# Patient Record
Sex: Male | Born: 1975
Health system: Southern US, Community
[De-identification: ages and names within clinical notes are randomized; demographics above are authoritative.]

## PROBLEM LIST (undated history)

## (undated) DIAGNOSIS — I10 Essential (primary) hypertension: Secondary | ICD-10-CM

## (undated) DIAGNOSIS — G4730 Sleep apnea, unspecified: Secondary | ICD-10-CM

## (undated) DIAGNOSIS — E119 Type 2 diabetes mellitus without complications: Secondary | ICD-10-CM

## (undated) DIAGNOSIS — E559 Vitamin D deficiency, unspecified: Secondary | ICD-10-CM

## (undated) DIAGNOSIS — E785 Hyperlipidemia, unspecified: Secondary | ICD-10-CM

## (undated) HISTORY — DX: Hyperlipidemia, unspecified: E78.5

## (undated) HISTORY — DX: Vitamin D deficiency, unspecified: E55.9

## (undated) HISTORY — DX: Essential (primary) hypertension: I10

## (undated) HISTORY — DX: Sleep apnea, unspecified: G47.30

## (undated) HISTORY — DX: Type 2 diabetes mellitus without complications: E11.9

---

## 2008-02-26 ENCOUNTER — Encounter: Admission: RE | Admit: 2008-02-26 | Discharge: 2008-02-26 | Payer: Self-pay | Admitting: Occupational Medicine

## 2015-03-01 ENCOUNTER — Ambulatory Visit: Payer: Self-pay

## 2015-03-01 ENCOUNTER — Other Ambulatory Visit: Payer: Self-pay | Admitting: Occupational Medicine

## 2015-03-01 DIAGNOSIS — M79671 Pain in right foot: Secondary | ICD-10-CM

## 2015-03-08 ENCOUNTER — Ambulatory Visit: Payer: Self-pay

## 2015-03-08 ENCOUNTER — Other Ambulatory Visit: Payer: Self-pay | Admitting: Occupational Medicine

## 2015-03-08 DIAGNOSIS — M79671 Pain in right foot: Secondary | ICD-10-CM

## 2015-07-03 ENCOUNTER — Encounter (HOSPITAL_BASED_OUTPATIENT_CLINIC_OR_DEPARTMENT_OTHER): Payer: Self-pay | Admitting: *Deleted

## 2015-07-03 ENCOUNTER — Emergency Department (HOSPITAL_BASED_OUTPATIENT_CLINIC_OR_DEPARTMENT_OTHER)
Admission: EM | Admit: 2015-07-03 | Discharge: 2015-07-03 | Disposition: A | Discharge status: Home / Self Care | Payer: BLUE CROSS/BLUE SHIELD | Attending: Emergency Medicine | Admitting: Emergency Medicine

## 2015-07-03 DIAGNOSIS — Z7982 Long term (current) use of aspirin: Secondary | ICD-10-CM | POA: Insufficient documentation

## 2015-07-03 DIAGNOSIS — M5412 Radiculopathy, cervical region: Secondary | ICD-10-CM | POA: Insufficient documentation

## 2015-07-03 DIAGNOSIS — M542 Cervicalgia: Secondary | ICD-10-CM | POA: Diagnosis present

## 2015-07-03 MED ORDER — HYDROCODONE-ACETAMINOPHEN 5-325 MG PO TABS: 1.0000 | ORAL_TABLET | Freq: Four times a day (QID) | ORAL | Status: DC | PRN

## 2015-07-03 NOTE — ED Provider Notes (Signed)
CSN: [id number]     Arrival date & time 07/03/15  1830 History  By signing my name below, I, Alan Wood, attest that this documentation has been prepared under the direction and in the presence of Alan LibraJohn Kamerin Grumbine, MD.   Electronically Signed: [name], ED Scribe. 07/03/2015. 11:13 PM   Chief Complaint  Patient presents with  . Neck and Shoulder Pain    HPI HPI Comments: Alan Wood is a 40 y.o. male who presents to the Emergency Department complaining of gradual onset, constant, sharp, moderate right posterior shoulder pain originating in his neck, ongoing for one week. There is no associated numbness or weakness. His shoulder pain is worse with rotation of his neck to the left. He also notes mild central neck pain with anterior flexion of his neck. Pt received a steroid injection in both hips last week which relieved pain in his left shoulder but the pain has now worsened on the right. Pt is currently taken tizanidine, tramadol, and nabumetone with minimal relief to symptoms. No other worsening or alleviating factors noted. Patient also had some chest tightness earlier today which has resolved. He attributes this to stress.Alan Wood.   History reviewed. No pertinent past medical history. History reviewed. No pertinent past surgical history. No family history on file. Social History  Substance Use Topics  . Smoking status: Never Smoker   . Smokeless tobacco: None  . Alcohol Use: Yes    Review of Systems A complete 10 system review of systems was obtained and all systems are negative except as noted in the HPI and PMH.   Allergies  Review of patient's allergies indicates no known allergies.  Home Medications   Prior to Admission medications   Medication Sig Start Date End Date Taking? Authorizing Provider  aspirin 81 MG tablet Take 81 mg by mouth daily.   Yes Historical Provider, MD  HYDROcodone-acetaminophen (NORCO) 5-325 MG tablet Take 1-2 tablets by mouth every 6 (six) hours as  needed (for pain). 07/03/15   Ireland Chagnon, MD   BP 130/93 mmHg  Pulse 63  Temp(Src) 98.9 F (37.2 C) (Oral)  Resp 18  Ht 6\' 1"  (1.854 m)  Wt 285 lb (129.275 kg)  BMI 37.61 kg/m2  SpO2 97%  Physical Exam General: Well-developed, well-nourished male in no acute distress; appearance consistent with age of record HENT: normocephalic; atraumatic Eyes: pupils equal, round and reactive to light; extraocular muscles intact Neck: supple; rotation of neck to the left reproduces pain in shoulder; central neck pain with anterior flexion of neck Heart: regular rate and rhythm Lungs: clear to auscultation bilaterally Abdomen: soft; nondistended; nontender; no masses or hepatosplenomegaly; bowel sounds present Extremities: No deformity; full range of motion; pulses normal; right posterior shoulder tenderness;  Neurologic: Awake, alert and oriented; motor function intact in all extremities and symmetric; no facial droop Skin: Warm and dry Psychiatric: Normal mood and affect  ED Course  Procedures (including critical care time) DIAGNOSTIC STUDIES: Oxygen Saturation is 97% on RA, normal by my interpretation.     EKG Interpretation   Date/Time:  Monday Jul 03 2015 20:28:38 EDT Ventricular Rate:  83 PR Interval:  164 QRS Duration: 104 QT Interval:  382 QTC Calculation: 448 R Axis:   22 Text Interpretation:  Normal sinus rhythm Possible Left atrial enlargement  Borderline ECG No previous ECGs available Confirmed by Read DriversMOLPUS  MD, Jonny RuizJOHN  [phone] on 07/03/2015 10:39:05 PM      MDM   Final diagnoses:  Cervical radiculopathy   I personally  performed the services described in this documentation, which was scribed in my presence. The recorded information has been reviewed and is accurate.      Alan Libra, MD 07/03/15 [phone]

## 2015-07-03 NOTE — ED Notes (Signed)
MD at bedside. (Molpus) 

## 2015-07-03 NOTE — ED Notes (Signed)
Neck and right shoulder pain. He got an injection in his left last week and that pain went away after he had steroid injections in both his hips.

## 2015-07-03 NOTE — Discharge Instructions (Signed)

## 2015-07-03 NOTE — ED Notes (Signed)
Pt states he is having tightness in the center of his chest. His right shoulder and neck continue to hurt also. EKG requested.

## 2017-11-17 ENCOUNTER — Encounter (HOSPITAL_BASED_OUTPATIENT_CLINIC_OR_DEPARTMENT_OTHER): Payer: Self-pay | Admitting: Emergency Medicine

## 2017-11-17 ENCOUNTER — Other Ambulatory Visit: Payer: Self-pay

## 2017-11-17 ENCOUNTER — Emergency Department (HOSPITAL_BASED_OUTPATIENT_CLINIC_OR_DEPARTMENT_OTHER)
Admission: EM | Admit: 2017-11-17 | Discharge: 2017-11-17 | Disposition: A | Discharge status: Home / Self Care | Payer: Self-pay | Attending: Emergency Medicine | Admitting: Emergency Medicine

## 2017-11-17 ENCOUNTER — Emergency Department (HOSPITAL_BASED_OUTPATIENT_CLINIC_OR_DEPARTMENT_OTHER): Payer: Self-pay

## 2017-11-17 DIAGNOSIS — M5412 Radiculopathy, cervical region: Secondary | ICD-10-CM | POA: Insufficient documentation

## 2017-11-17 DIAGNOSIS — R531 Weakness: Secondary | ICD-10-CM | POA: Insufficient documentation

## 2017-11-17 DIAGNOSIS — Z7982 Long term (current) use of aspirin: Secondary | ICD-10-CM | POA: Insufficient documentation

## 2017-11-17 MED ORDER — CYCLOBENZAPRINE HCL 5 MG PO TABS: 5.0000 mg | ORAL_TABLET | Freq: Three times a day (TID) | ORAL | 0 refills | Status: DC | PRN

## 2017-11-17 MED ORDER — IBUPROFEN 800 MG PO TABS: 800.0000 mg | ORAL_TABLET | Freq: Four times a day (QID) | ORAL | 0 refills | Status: DC | PRN

## 2017-11-17 MED ORDER — IBUPROFEN 800 MG PO TABS
800.0000 mg | ORAL_TABLET | Freq: Once | ORAL | Status: AC
  Administered 2017-11-17: 800 mg via ORAL
  Filled 2017-11-17: qty 1

## 2017-11-17 MED FILL — CYCLOBENZAPRINE 5 MG TABLET: 5 | 3 days supply | Qty: 10 | Fill #0

## 2017-11-17 MED FILL — IBUPROFEN 800 MG TAB: 800 | 5 days supply | Qty: 21 | Fill #0

## 2017-11-17 NOTE — Discharge Instructions (Signed)
Take motrin for pain.   Take flexeril for muscle spasms   You likely have a pinched nerve causing your symptoms.   Follow up with Dr. Pearletha Forge in a week if you still have numbness and weakness  Rest today   Return to ER if you have worse numbness, weakness, trouble speaking, chest pain

## 2017-11-17 NOTE — ED Triage Notes (Signed)
Pt states that Saturday his left hand started hurting. It has gotten progressively worse and now it has the discomfort and numbness/tingling. Pt states it feels like his left arm is weaker than normal. Denies any injury to limb or neck. Denies N/V, dizziness, Lightheadedness, CP, SHOB

## 2017-11-17 NOTE — ED Provider Notes (Signed)
[facility] EMERGENCY DEPARTMENT Provider Note   CSN: [id number] Arrival date & time: 11/17/17  4098     History   Chief Complaint Chief Complaint  Patient presents with  . Hand Pain  . Numbness    HPI Alan Wood is a 42 y.o. male otherwise healthy here with L arm numbness and subjective weakness.  Patient states that for the last 2 to 3 days, he feels some numbness in the left fourth and fifth fingers.  States that he has some subjective weakness but is not dropping anything.  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  Denies any trouble speaking or trouble walking.  States that he has a strong family history of coronary artery disease but denies any chest pain or shortness of breath.  Denies any recent travel history of blood clots.  Patient is otherwise healthy and no meds prior to arrival.  The history is provided by the patient.    History reviewed. No pertinent past medical history.  There are no active problems to display for this patient.   No past surgical history on file.      Home Medications    Prior to Admission medications   Medication Sig Start Date End Date Taking? Authorizing Provider  aspirin 81 MG tablet Take 81 mg by mouth daily.   Yes [provider]  HYDROcodone-acetaminophen (NORCO) 5-325 MG tablet Take 1-2 tablets by mouth every 6 (six) hours as needed (for pain). 07/03/15   Molpus, John, MD    Family History No family history on file.  Social History Social History   Tobacco Use  . Smoking status: Never Smoker  Substance Use Topics  . Alcohol use: Yes  . Drug use: No     Allergies   Patient has no known allergies.   Review of Systems Review of Systems  Neurological: Positive for numbness.  All other systems reviewed and are negative.    Physical Exam Updated Vital Signs BP (!) 131/96   Pulse 77   Temp 98.2 F (36.8 C) (Oral)   Ht 6\' 1"  (1.854 m)   Wt 127 kg   SpO2  95%   BMI 36.94 kg/m   Physical Exam  Constitutional: He is oriented to person, place, and time. He appears well-developed and well-nourished.  HENT:  Head: Normocephalic and atraumatic.  Mouth/Throat: Oropharynx is clear and moist.  Eyes: Pupils are equal, round, and reactive to light. Conjunctivae and EOM are normal.  Neck: Normal range of motion. Neck supple.  No obvious midline tenderness. Nl ROM   Cardiovascular: Normal rate, regular rhythm and normal heart sounds.  Pulmonary/Chest: Effort normal and breath sounds normal. No stridor. No respiratory distress.  Abdominal: Soft. Bowel sounds are normal. He exhibits no distension. There is no tenderness.  Musculoskeletal: Normal range of motion.  2+ pulses throughout   Neurological: He is alert and oriented to person, place, and time.  CN 2- 12 intact, no facial droop. Slightly dec sensation L 4th and 5th fingers but nl hand grasp and finger apposition and adduction. Nl strength bilateral upper and lower extremities. Nl finger to nose bilaterally.   Skin: Skin is warm. Capillary refill takes less than 2 seconds.  Psychiatric: He has a normal mood and affect.  Nursing note and vitals reviewed.    ED Treatments / Results  Labs (all labs ordered are listed, but only abnormal results are displayed) Labs Reviewed - No data to display  EKG EKG Interpretation  Date/Time:  Monday November 17 2017 07:33:05 EDT Ventricular Rate:  76 PR Interval:    QRS Duration: 110 QT Interval:  389 QTC Calculation: 438 R Axis:   28 Text Interpretation:  Sinus rhythm Consider left atrial enlargement No significant change since last tracing Confirmed by Richardean Canal [phone]) on 11/17/2017 7:41:06 AM   Radiology Dg Cervical Spine Complete  Result Date: 11/17/2017 CLINICAL DATA:  Left neck pain and left arm numbness beginning 2 days ago. EXAM: CERVICAL SPINE - COMPLETE 4+ VIEW COMPARISON:  06/25/2015 FINDINGS: There is no evidence of cervical spine  fracture or prevertebral soft tissue swelling. Alignment is normal. No other significant bone abnormalities are identified. IMPRESSION: Negative cervical spine radiographs. Electronically Signed   By: Myles Rosenthal M.D.   On: 11/17/2017 07:17    Procedures Procedures (including critical care time)  Medications Ordered in ED Medications  ibuprofen (ADVIL,MOTRIN) tablet 800 mg (800 mg Oral Given 11/17/17 0715)     Initial Impression / Assessment and Plan / ED Course  I have reviewed the triage vital signs and the nursing notes.  Pertinent labs & imaging results that were available during my care of the patient were reviewed by me and considered in my medical decision making (see chart for details).     Alan Wood is a 42 y.o. male here with L arm numbness. Likely cervical radiculopathy. No chest pain or SOB and no weakness on exam. No neck injury. Will get neck xray. Will give motrin.   7:43 AM EKG and xray unremarkable. Likely cervical radiculopathy. Will dc home with NSAIDs, muscle relaxants, ortho follow up.    Final Clinical Impressions(s) / ED Diagnoses   Final diagnoses:  None    ED Discharge Orders    None       Charlynne Pander, MD 11/17/17 [phone]

## 2017-11-17 NOTE — ED Notes (Signed)
ED Provider at bedside. 

## 2018-12-25 ENCOUNTER — Encounter (HOSPITAL_BASED_OUTPATIENT_CLINIC_OR_DEPARTMENT_OTHER): Payer: Self-pay | Admitting: *Deleted

## 2018-12-25 ENCOUNTER — Other Ambulatory Visit: Payer: Self-pay

## 2018-12-25 ENCOUNTER — Emergency Department (HOSPITAL_BASED_OUTPATIENT_CLINIC_OR_DEPARTMENT_OTHER): Payer: BC Managed Care – PPO

## 2018-12-25 ENCOUNTER — Emergency Department (HOSPITAL_BASED_OUTPATIENT_CLINIC_OR_DEPARTMENT_OTHER)
Admission: EM | Admit: 2018-12-25 | Discharge: 2018-12-26 | Disposition: A | Discharge status: Home / Self Care | Payer: BC Managed Care – PPO | Attending: Emergency Medicine | Admitting: Emergency Medicine

## 2018-12-25 DIAGNOSIS — R072 Precordial pain: Secondary | ICD-10-CM | POA: Insufficient documentation

## 2018-12-25 DIAGNOSIS — Z7982 Long term (current) use of aspirin: Secondary | ICD-10-CM | POA: Insufficient documentation

## 2018-12-25 DIAGNOSIS — R079 Chest pain, unspecified: Secondary | ICD-10-CM | POA: Diagnosis present

## 2018-12-25 LAB — CBC
HCT: 51.4 % (ref 39.0–52.0)
Hemoglobin: 16.7 g/dL (ref 13.0–17.0)
MCH: 28.9 pg (ref 26.0–34.0)
MCHC: 32.5 g/dL (ref 30.0–36.0)
MCV: 88.9 fL (ref 80.0–100.0)
Platelets: 228 10*3/uL (ref 150–400)
RBC: 5.78 MIL/uL (ref 4.22–5.81)
RDW: 12.9 % (ref 11.5–15.5)
WBC: 9.7 10*3/uL (ref 4.0–10.5)
nRBC: 0 % (ref 0.0–0.2)

## 2018-12-25 NOTE — ED Triage Notes (Signed)
Chest pain dull in nature since this am.

## 2018-12-25 NOTE — ED Provider Notes (Signed)
[facility] EMERGENCY DEPARTMENT Provider Note   CSN: [id number] Arrival date & time: 12/25/18  1910     History   Chief Complaint Chief Complaint  Patient presents with  . Chest Pain    HPI Alan Wood is a 43 y.o. male.     Patient with no significant past medical history presents to the emergency department today with chest pain.  Patient describes a dull ache in the left side of his chest starting early this morning.  Symptoms started about 2 AM while he was watching TV.  No exertion.  No preceding symptoms.  He states that he had a negative stress test several years ago.  No associated radiation of pain or diaphoresis.  No vomiting.  Symptoms have resolved and are intermittent at the current time.  States that he laughed earlier and could feel some pain but at time of exam he had no tightness or pain.  Patient denies any recent cough or fever.  He denies any sick contacts including those with coronavirus.  Patient is not a smoker.  No hypertension, hyperlipidemia, diabetes.  Family history significant for several second-degree relatives with heart disease in their 7450s.  Patient denies risk factors for pulmonary embolism including: unilateral leg swelling, history of DVT/PE/other blood clots, use of exogenous hormones, recent immobilizations, recent surgery, recent travel (>4hr segment), malignancy, hemoptysis.       History reviewed. No pertinent past medical history.  There are no active problems to display for this patient.   History reviewed. No pertinent surgical history.      Home Medications    Prior to Admission medications   Medication Sig Start Date End Date Taking? Authorizing Provider  aspirin 81 MG tablet Take 81 mg by mouth daily.   Yes [provider]  cyclobenzaprine (FLEXERIL) 5 MG tablet Take 1 tablet (5 mg total) by mouth 3 (three) times daily as needed for muscle spasms. 11/17/17   Charlynne PanderYao, David Hsienta, MD  HYDROcodone-acetaminophen  (NORCO) 5-325 MG tablet Take 1-2 tablets by mouth every 6 (six) hours as needed (for pain). 07/03/15   Molpus, John, MD  ibuprofen (ADVIL,MOTRIN) 800 MG tablet Take 1 tablet (800 mg total) by mouth every 6 (six) hours as needed. 11/17/17   Charlynne PanderYao, David Hsienta, MD    Family History No family history on file.  Social History Social History   Tobacco Use  . Smoking status: Never Smoker  . Smokeless tobacco: Never Used  Substance Use Topics  . Alcohol use: Yes  . Drug use: No     Allergies   Patient has no known allergies.   Review of Systems Review of Systems  Constitutional: Negative for diaphoresis and fever.  Eyes: Negative for redness.  Respiratory: Negative for cough and shortness of breath.   Cardiovascular: Positive for chest pain. Negative for palpitations and leg swelling.  Gastrointestinal: Negative for abdominal pain, nausea and vomiting.  Genitourinary: Negative for dysuria.  Musculoskeletal: Negative for back pain and neck pain.  Skin: Negative for rash.  Neurological: Negative for syncope and light-headedness.  Psychiatric/Behavioral: The patient is not nervous/anxious.      Physical Exam Updated Vital Signs BP (!) 135/101 (BP Location: Right Arm)   Pulse 70   Temp 99.1 F (37.3 C) (Oral)   Resp (!) 22   Ht 6\' 1"  (1.854 m)   Wt 129.3 kg   SpO2 96%   BMI 37.60 kg/m   Physical Exam Vitals signs and nursing note reviewed.  Constitutional:  Appearance: He is well-developed. He is not diaphoretic.  HENT:     Head: Normocephalic and atraumatic.     Mouth/Throat:     Mouth: Mucous membranes are not dry.  Eyes:     Conjunctiva/sclera: Conjunctivae normal.  Neck:     Musculoskeletal: Normal range of motion and neck supple. No muscular tenderness.     Vascular: Normal carotid pulses. No carotid bruit or JVD.     Trachea: Trachea normal. No tracheal deviation.  Cardiovascular:     Rate and Rhythm: Normal rate and regular rhythm.     Pulses: No  decreased pulses.     Heart sounds: Normal heart sounds, S1 normal and S2 normal. Heart sounds not distant. No murmur.  Pulmonary:     Effort: Pulmonary effort is normal. No respiratory distress.     Breath sounds: Normal breath sounds. No wheezing.  Chest:     Chest wall: No tenderness.  Abdominal:     General: Bowel sounds are normal.     Palpations: Abdomen is soft.     Tenderness: There is no abdominal tenderness. There is no guarding or rebound.  Skin:    General: Skin is warm and dry.     Coloration: Skin is not pale.  Neurological:     Mental Status: He is alert.      ED Treatments / Results  Labs (all labs ordered are listed, but only abnormal results are displayed) Labs Reviewed  CBC  BASIC METABOLIC PANEL  TROPONIN I (HIGH SENSITIVITY)  TROPONIN I (HIGH SENSITIVITY)    ED ECG REPORT   Date: 12/25/2018  Rate: 76  Rhythm: normal sinus rhythm  QRS Axis: normal  Intervals: normal  ST/T Wave abnormalities: normal  Conduction Disutrbances:none  Narrative Interpretation:   Old EKG Reviewed: unchanged  I have personally reviewed the EKG tracing and agree with the computerized printout as noted.  Radiology Dg Chest 2 View  Result Date: 12/25/2018 CLINICAL DATA:  Chest pain and shortness of breath. EXAM: CHEST - 2 VIEW COMPARISON:  02/26/2008 FINDINGS: The cardiomediastinal contours are normal. Mild streaky bibasilar opacities, left greater than right. Pulmonary vasculature is normal. No consolidation, pleural effusion, or pneumothorax. No acute osseous abnormalities are seen. IMPRESSION: Streaky bibasilar opacities, left greater than right. Findings favor atelectasis, however atypical viral pneumonia could have a similar appearance. Electronically Signed   By: Keith Rake M.D.   On: 12/25/2018 20:34    Procedures Procedures (including critical care time)  Medications Ordered in ED Medications - No data to display   Initial Impression / Assessment and  Plan / ED Course  I have reviewed the triage vital signs and the nursing notes.  Pertinent labs & imaging results that were available during my care of the patient were reviewed by me and considered in my medical decision making (see chart for details).        Patient seen and examined.  EKG and chest x-ray reviewed.  I doubt ACS or PE given atypical features.  Doubt pneumonia despite streaky opacities noted on the patient's x-ray.  He does not have any shortness of breath or other symptoms which would be concerning for coronavirus.  Vital signs reviewed and are as follows: BP (!) 135/101 (BP Location: Right Arm)   Pulse 70   Temp 99.1 F (37.3 C) (Oral)   Resp (!) 22   Ht 6\' 1"  (1.854 m)   Wt 129.3 kg   SpO2 96%   BMI 37.60 kg/m   12:24  AM troponin, CBC, BMP all normal.  Patient remains comfortable, no recurrent pain.  We will discharge home at this time with return instructions as above.  Encouraged PCP follow-up.  Patient was counseled to return with severe chest pain, especially if the pain is crushing or pressure-like and spreads to the arms, back, neck, or jaw, or if they have sweating, nausea, or shortness of breath with the pain. They were encouraged to call 911 with these symptoms.   They were also told to return if their chest pain gets worse and does not go away with rest, they have an attack of chest pain lasting longer than usual despite rest and treatment with the medications their caregiver has prescribed, if they wake from sleep with chest pain or shortness of breath, if they feel dizzy or faint, if they have chest pain not typical of their usual pain, or if they have any other emergent concerns regarding their health.  The patient verbalized understanding and agreed.     Final Clinical Impressions(s) / ED Diagnoses   Final diagnoses:  Precordial pain   Patient with atypical chest pain with normal troponin, nonischemic EKG.  No clinical features consistent with  DVT/PE.  Unclear etiology but doubt ACS, PE, dissection.  Patient is pain-free at time of discharge and appears comfortable.  Low risk heart score.  Encourage PCP follow-up, strict return as above.  ED Discharge Orders    None       Hassel, Uphoff, PA-C 12/26/18 Vladimir Crofts, MD 01/03/19 [phone]

## 2018-12-26 LAB — BASIC METABOLIC PANEL
Anion gap: 11 (ref 5–15)
BUN: 12 mg/dL (ref 6–20)
CO2: 25 mmol/L (ref 22–32)
Calcium: 9.1 mg/dL (ref 8.9–10.3)
Chloride: 100 mmol/L (ref 98–111)
Creatinine, Ser: 0.89 mg/dL (ref 0.61–1.24)
GFR calc Af Amer: >60 mL/min (ref >60)
GFR calc non Af Amer: >60 mL/min (ref >60)
Glucose, Bld: 94 mg/dL (ref 70–99)
Potassium: 3.6 mmol/L (ref 3.5–5.1)
Sodium: 136 mmol/L (ref 135–145)

## 2018-12-26 LAB — TROPONIN I (HIGH SENSITIVITY): Troponin I (High Sensitivity): <2 ng/L (ref <18)

## 2018-12-26 NOTE — Discharge Instructions (Signed)
Please read and follow all provided instructions.  Your diagnoses today include:  1. Precordial pain     Tests performed today include:  An EKG of your heart  A chest x-ray  Cardiac enzymes - a blood test for heart muscle damage  Blood counts and electrolytes  Vital signs. See below for your results today.   Medications prescribed:   None  Take any prescribed medications only as directed.  Follow-up instructions: Please follow-up with your primary care provider as soon as you can for further evaluation of your symptoms.   Return instructions:  SEEK IMMEDIATE MEDICAL ATTENTION IF:  You have severe chest pain, especially if the pain is crushing or pressure-like and spreads to the arms, back, neck, or jaw, or if you have sweating, nausea (feeling sick to your stomach), or shortness of breath. THIS IS AN EMERGENCY. Don't wait to see if the pain will go away. Get medical help at once. Call 911 or 0 (operator). DO NOT drive yourself to the hospital.   Your chest pain gets worse and does not go away with rest.   You have an attack of chest pain lasting longer than usual, despite rest and treatment with the medications your caregiver has prescribed.   You wake from sleep with chest pain or shortness of breath.  You feel dizzy or faint.  You have chest pain not typical of your usual pain for which you originally saw your caregiver.   You have any other emergent concerns regarding your health.  Additional Information: Chest pain comes from many different causes. Your caregiver has diagnosed you as having chest pain that is not specific for one problem, but does not require admission.  You are at low risk for an acute heart condition or other serious illness.   Your vital signs today were: BP (!) 135/101 (BP Location: Right Arm)    Pulse 70    Temp 99.1 F (37.3 C) (Oral)    Resp (!) 22    Ht 6\' 1"  (1.854 m)    Wt 129.3 kg    SpO2 96%    BMI 37.60 kg/m  If your blood pressure  (BP) was elevated above 135/85 this visit, please have this repeated by your doctor within one month. --------------

## 2019-12-22 ENCOUNTER — Other Ambulatory Visit: Payer: Self-pay

## 2019-12-22 ENCOUNTER — Encounter (HOSPITAL_BASED_OUTPATIENT_CLINIC_OR_DEPARTMENT_OTHER): Payer: Self-pay | Admitting: *Deleted

## 2019-12-22 ENCOUNTER — Emergency Department (HOSPITAL_BASED_OUTPATIENT_CLINIC_OR_DEPARTMENT_OTHER)
Admission: EM | Admit: 2019-12-22 | Discharge: 2019-12-22 | Disposition: A | Discharge status: Home / Self Care | Payer: BC Managed Care – PPO | Attending: Emergency Medicine | Admitting: Emergency Medicine

## 2019-12-22 DIAGNOSIS — S39012A Strain of muscle, fascia and tendon of lower back, initial encounter: Secondary | ICD-10-CM | POA: Insufficient documentation

## 2019-12-22 DIAGNOSIS — M25512 Pain in left shoulder: Secondary | ICD-10-CM | POA: Insufficient documentation

## 2019-12-22 DIAGNOSIS — Z7982 Long term (current) use of aspirin: Secondary | ICD-10-CM | POA: Diagnosis not present

## 2019-12-22 DIAGNOSIS — Y33XXXA Other specified events, undetermined intent, initial encounter: Secondary | ICD-10-CM | POA: Diagnosis not present

## 2019-12-22 DIAGNOSIS — M542 Cervicalgia: Secondary | ICD-10-CM | POA: Diagnosis not present

## 2019-12-22 DIAGNOSIS — T148XXA Other injury of unspecified body region, initial encounter: Secondary | ICD-10-CM

## 2019-12-22 MED ORDER — PREDNISONE 10 MG (21) PO TBPK: ORAL_TABLET | Freq: Every day | ORAL | 0 refills | Status: DC

## 2019-12-22 MED ORDER — METHOCARBAMOL 500 MG PO TABS: 500.0000 mg | ORAL_TABLET | Freq: Two times a day (BID) | ORAL | 0 refills | Status: DC

## 2019-12-22 NOTE — ED Triage Notes (Signed)
Back pain started 10/31.  Went to UC on 11/3.  Prescription for cyclobenzaprine & Ibuprofen with no relief. Left side of neck, shoulder and left arm pain started Monday.  Denies injury.

## 2019-12-22 NOTE — ED Provider Notes (Signed)
[facility] EMERGENCY DEPARTMENT Provider Note   CSN: [id number] Arrival date & time: 12/22/19  1057     History Chief Complaint  Patient presents with  . Neck, shoulder, back pain    Alan Wood is a 44 y.o. male.  HPI Pt is a 44 year old male who presents to the ED due to left sided neck pain. He states that his pain initially started in the left thoracic region and spread upwards. He was seen 1 week ago at [facility] and given Flexeril, APAP and ibuprofen. He has been taking these with little relief. He now notes his pain is along the left neck. Worsens with head movement. Radiates to left shoulder. No numbness, tingling, weakness, CP, SOB, difficulty swallowing.     History reviewed. No pertinent past medical history.  There are no problems to display for this patient.   History reviewed. No pertinent surgical history.     History reviewed. No pertinent family history.  Social History   Tobacco Use  . Smoking status: Never Smoker  . Smokeless tobacco: Never Used  Substance Use Topics  . Alcohol use: Yes    Comment: occasionally  . Drug use: No    Home Medications Prior to Admission medications   Medication Sig Start Date End Date Taking? Authorizing Provider  aspirin 81 MG tablet Take 81 mg by mouth daily.    [provider]  cyclobenzaprine (FLEXERIL) 5 MG tablet Take 1 tablet (5 mg total) by mouth 3 (three) times daily as needed for muscle spasms. 11/17/17   Charlynne Pander, MD  HYDROcodone-acetaminophen (NORCO) 5-325 MG tablet Take 1-2 tablets by mouth every 6 (six) hours as needed (for pain). 07/03/15   Molpus, John, MD  ibuprofen (ADVIL,MOTRIN) 800 MG tablet Take 1 tablet (800 mg total) by mouth every 6 (six) hours as needed. 11/17/17   Charlynne Pander, MD  meloxicam (MOBIC) 15 MG tablet Take 15 mg by mouth daily. 12/15/19   [provider]  methocarbamol (ROBAXIN) 500 MG tablet Take 1 tablet (500 mg total) by mouth 2 (two) times  daily. 12/22/19   Placido Sou, PA-C  predniSONE (STERAPRED UNI-PAK 21 TAB) 10 MG (21) TBPK tablet Take by mouth daily. Take 6 tabs by mouth daily  for 2 days, then 5 tabs for 2 days, then 4 tabs for 2 days, then 3 tabs for 2 days, 2 tabs for 2 days, then 1 tab by mouth daily for 2 days 12/22/19   Placido Sou, PA-C    Allergies    Patient has no known allergies.  Review of Systems   Review of Systems  Constitutional: Negative for chills and fever.  Respiratory: Negative for shortness of breath.   Cardiovascular: Negative for chest pain.  Musculoskeletal: Positive for myalgias and neck pain. Negative for back pain and neck stiffness.  Neurological: Negative for weakness and numbness.    Physical Exam Updated Vital Signs BP (!) 150/103 (BP Location: Left Arm)   Pulse 90   Temp 98.5 F (36.9 C) (Oral)   Resp 16   Ht 6\' 1"  (1.854 m)   Wt 136.1 kg   SpO2 97%   BMI 39.58 kg/m   Physical Exam Vitals and nursing note reviewed.  Constitutional:      General: He is not in acute distress.    Appearance: He is well-developed.  HENT:     Head: Normocephalic and atraumatic.     Right Ear: External ear normal.     Left  Ear: External ear normal.  Eyes:     General: No scleral icterus.       Right eye: No discharge.        Left eye: No discharge.     Conjunctiva/sclera: Conjunctivae normal.  Neck:     Trachea: No tracheal deviation.     Comments: No midline C, T, or L spine tenderness. Moderate TTP noted to the left neck along the paraspinal musculature. Additional moderate TTP noted to the musculature of the left superior shoulder. No bony TTP. No midline spine TTP. Pain worsens with right lateral rotation of the head. Cardiovascular:     Rate and Rhythm: Normal rate.  Pulmonary:     Effort: Pulmonary effort is normal. No respiratory distress.     Breath sounds: No stridor.  Abdominal:     General: There is no distension.  Musculoskeletal:        General: Tenderness  present. No swelling or deformity.     Cervical back: Normal range of motion and neck supple. Tenderness present.  Skin:    General: Skin is warm and dry.     Findings: No rash.  Neurological:     General: No focal deficit present.     Mental Status: He is alert and oriented to person, place, and time.     Cranial Nerves: Cranial nerve deficit: no gross deficits.     Comments: Strength is 5/5 in all four extremities. Distal sensation intact.    ED Results / Procedures / Treatments   Labs (all labs ordered are listed, but only abnormal results are displayed) Labs Reviewed - No data to display  EKG None  Radiology No results found.  Procedures Procedures (including critical care time)  Medications Ordered in ED Medications - No data to display  ED Course  I have reviewed the triage vital signs and the nursing notes.  Pertinent labs & imaging results that were available during my care of the patient were reviewed by me and considered in my medical decision making (see chart for details).    MDM Rules/Calculators/A&P                         Pt is a 44 y.o. male that presents with a history, physical exam, and ED Clinical Course as noted above.   Pt presents with muscular pain along the left neck and left shoulder. No bony tenderness. Neuro exam is benign.  No fevers or chills.  Did not feel imaging was warranted and patient is in agreement. No midline spine tenderness. 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 I believe he might be experiencing muscle spasms due to a lack of movement from his job which may be worsening due to being in a seated position throughout the day.   Recommended patient discontinue the Flexeril as he is not getting any relief.  We will start him on Robaxin.  We discussed a regimen for Tylenol and ibuprofen.  Multiple topical analgesics.  Patient has no known history of diabetes mellitus.  We will start him on a course of prednisone as  well.  He understands to return to the ED with any new or worsening symptoms.  Recommended that he follow-up with his PCP if his symptoms do not improve.  His questions were answered and he was amicable at the time of discharge.  He was mildly hypertensive but otherwise his vital signs are stable.  No chest pain or shortness of breath.  He is planning on discussing his blood pressure with his PCP at an upcoming appointment he already has scheduled.   An After Visit Summary was printed and given to the patient.  Patient discharged to home/self care.  Condition at discharge: Stable  Note: Portions of this report may have been transcribed using voice recognition software. Every effort was made to ensure accuracy; however, inadvertent computerized transcription errors may be present.   Final Clinical Impression(s) / ED Diagnoses Final diagnoses:  Neck pain  Acute pain of left shoulder  Muscle strain   Rx / DC Orders ED Discharge Orders         Ordered    methocarbamol (ROBAXIN) 500 MG tablet  2 times daily        12/22/19 1158    predniSONE (STERAPRED UNI-PAK 21 TAB) 10 MG (21) TBPK tablet  Daily        12/22/19 1158           Placido Sou, PA-C 12/22/19 1224    Little, Ambrose Finland, MD 12/23/19 [phone]

## 2019-12-22 NOTE — Discharge Instructions (Addendum)
I recommend a combination of tylenol and ibuprofen for management of your pain. You can take a low dose of both at the same time. I recommend 325 mg of Tylenol combined with 400 mg of ibuprofen. This is one regular Tylenol and two regular ibuprofen. You can take these 2-3 times for day for your pain. Please try to take these medications with a small amount of food as well to prevent upsetting your stomach.  Also, please consider topical pain relieving creams such as Voltaran Gel, BioFreeze, or Icy Hot. There is also a pain relieving cream made by [employer]. You should be able to find all of these at your local pharmacy.   I am prescribing you a strong muscle relaxer called robaxin. Do not mix it with alcohol. Do not drive a vehicle after taking it.  Please return to the ER with any new or worsening symptoms.  Please follow-up to primary care provider.  It was a pleasure to meet you.

## 2019-12-22 NOTE — ED Notes (Signed)
Review D/C papers with pt, reviewed RX with pt, pt states understanding, pt denies questions at this time.

## 2020-05-18 ENCOUNTER — Emergency Department (HOSPITAL_BASED_OUTPATIENT_CLINIC_OR_DEPARTMENT_OTHER): Payer: BC Managed Care – PPO

## 2020-05-18 ENCOUNTER — Encounter (HOSPITAL_BASED_OUTPATIENT_CLINIC_OR_DEPARTMENT_OTHER): Payer: Self-pay | Admitting: *Deleted

## 2020-05-18 ENCOUNTER — Other Ambulatory Visit: Payer: Self-pay

## 2020-05-18 ENCOUNTER — Emergency Department (HOSPITAL_BASED_OUTPATIENT_CLINIC_OR_DEPARTMENT_OTHER)
Admission: EM | Admit: 2020-05-18 | Discharge: 2020-05-18 | Disposition: A | Discharge status: Home / Self Care | Payer: BC Managed Care – PPO | Attending: Emergency Medicine | Admitting: Emergency Medicine

## 2020-05-18 DIAGNOSIS — M545 Low back pain, unspecified: Secondary | ICD-10-CM

## 2020-05-18 DIAGNOSIS — Z7982 Long term (current) use of aspirin: Secondary | ICD-10-CM | POA: Insufficient documentation

## 2020-05-18 DIAGNOSIS — M542 Cervicalgia: Secondary | ICD-10-CM | POA: Diagnosis not present

## 2020-05-18 DIAGNOSIS — Y92410 Unspecified street and highway as the place of occurrence of the external cause: Secondary | ICD-10-CM | POA: Insufficient documentation

## 2020-05-18 DIAGNOSIS — M79602 Pain in left arm: Secondary | ICD-10-CM | POA: Insufficient documentation

## 2020-05-18 DIAGNOSIS — M549 Dorsalgia, unspecified: Secondary | ICD-10-CM | POA: Diagnosis present

## 2020-05-18 MED ORDER — METHOCARBAMOL 500 MG PO TABS: 500.0000 mg | ORAL_TABLET | Freq: Two times a day (BID) | ORAL | 0 refills | Status: DC
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
## 2020-05-18 NOTE — Discharge Instructions (Signed)
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
## 2021-03-16 ENCOUNTER — Emergency Department (HOSPITAL_BASED_OUTPATIENT_CLINIC_OR_DEPARTMENT_OTHER)
Admission: EM | Admit: 2021-03-16 | Discharge: 2021-03-16 | Disposition: A | Discharge status: Home / Self Care | Payer: BC Managed Care – PPO | Attending: Emergency Medicine | Admitting: Emergency Medicine

## 2021-03-16 ENCOUNTER — Other Ambulatory Visit: Payer: Self-pay

## 2021-03-16 ENCOUNTER — Encounter (HOSPITAL_BASED_OUTPATIENT_CLINIC_OR_DEPARTMENT_OTHER): Payer: Self-pay | Admitting: Emergency Medicine

## 2021-03-16 ENCOUNTER — Other Ambulatory Visit (HOSPITAL_BASED_OUTPATIENT_CLINIC_OR_DEPARTMENT_OTHER): Payer: Self-pay

## 2021-03-16 DIAGNOSIS — M5442 Lumbago with sciatica, left side: Secondary | ICD-10-CM | POA: Insufficient documentation

## 2021-03-16 DIAGNOSIS — M545 Low back pain, unspecified: Secondary | ICD-10-CM | POA: Diagnosis present

## 2021-03-16 MED ORDER — CYCLOBENZAPRINE HCL 10 MG PO TABS
10.0000 mg | ORAL_TABLET | Freq: Two times a day (BID) | ORAL | 0 refills | Status: AC | PRN
  Filled 2021-03-16: qty 20, 10d supply, fill #0

## 2021-03-16 MED ORDER — IBUPROFEN 800 MG PO TABS
800.0000 mg | ORAL_TABLET | Freq: Three times a day (TID) | ORAL | 0 refills | Status: AC
  Filled 2021-03-16: qty 21, 7d supply, fill #0

## 2021-03-16 MED ORDER — KETOROLAC TROMETHAMINE 60 MG/2ML IM SOLN
60.0000 mg | Freq: Once | INTRAMUSCULAR | Status: DC
  Filled 2021-03-16: qty 2

## 2021-03-16 MED ORDER — METHYLPREDNISOLONE 4 MG PO TBPK
ORAL_TABLET | ORAL | 0 refills | Status: AC
  Filled 2021-03-16: qty 21, 6d supply, fill #0

## 2021-03-16 NOTE — ED Triage Notes (Signed)
Lower back pain x 1 week, started going down left leg 2 days ago.  Progressively worsening.  Pt drove himself here.  No elimination issues.  Some numbness in left leg.

## 2021-03-16 NOTE — ED Provider Notes (Signed)
[facility] EMERGENCY DEPARTMENT Provider Note   CSN: [id number] Arrival date & time: 03/16/21  1007     History  Chief Complaint  Patient presents with   Leg Pain   Back Pain    Alan Wood is a 46 y.o. male.  The history is provided by the patient.  Back Pain Location:  Lumbar spine Quality:  Aching Radiates to: left leg. Pain severity:  Mild Onset quality:  Gradual Duration:  1 week Timing:  Intermittent Progression:  Waxing and waning Context: lifting heavy objects   Worsened by:  Palpation and movement Associated symptoms: leg pain and paresthesias   Associated symptoms: no abdominal pain, no abdominal swelling, no bladder incontinence, no bowel incontinence, no chest pain, no fever, no headaches, no numbness, no pelvic pain, no perianal numbness, no tingling, no weakness and no weight loss       Home Medications Prior to Admission medications   Medication Sig Start Date End Date Taking? Authorizing Provider  cyclobenzaprine (FLEXERIL) 10 MG tablet Take 1 tablet (10 mg total) by mouth 2 (two) times daily as needed for muscle spasms. 03/16/21  Yes Eivan Gallina, DO  ibuprofen (ADVIL) 800 MG tablet Take 1 tablet (800 mg total) by mouth 3 (three) times daily. 03/16/21  Yes Delphia Kaylor, DO  methylPREDNISolone (MEDROL DOSEPAK) 4 MG TBPK tablet Follow package insert 03/16/21  Yes Anup Brigham, DO  aspirin 81 MG tablet Take 81 mg by mouth daily.    [provider]      Allergies    Patient has no known allergies.    Review of Systems   Review of Systems  Constitutional:  Negative for fever and weight loss.  Cardiovascular:  Negative for chest pain.  Gastrointestinal:  Negative for abdominal pain and bowel incontinence.  Genitourinary:  Negative for bladder incontinence and pelvic pain.  Musculoskeletal:  Positive for back pain.  Neurological:  Positive for paresthesias. Negative for tingling, weakness, numbness and headaches.   Physical  Exam Updated Vital Signs BP (!) 157/113 (BP Location: Right Arm)    Pulse 96    Temp 97.8 F (36.6 C) (Oral)    Resp 18    Ht 6\' 1"  (1.854 m)    Wt 131.5 kg    SpO2 99%    BMI 38.26 kg/m  Physical Exam Constitutional:      General: He is not in acute distress.    Appearance: He is not ill-appearing.  Cardiovascular:     Rate and Rhythm: Normal rate.     Pulses: Normal pulses.     Heart sounds: Normal heart sounds.  Musculoskeletal:        General: Tenderness present. No swelling. Normal range of motion.     Cervical back: Normal range of motion.     Comments: Tenderness to palpation to the paraspinal lumbar muscles on the left as well as the left gluteal muscles  Skin:    General: Skin is warm.     Capillary Refill: Capillary refill takes less than 2 seconds.  Neurological:     General: No focal deficit present.     Mental Status: He is alert.     Comments: 5+ out of 5 strength throughout, normal sensation    ED Results / Procedures / Treatments   Labs (all labs ordered are listed, but only abnormal results are displayed) Labs Reviewed - No data to display  EKG None  Radiology No results found.  Procedures Procedures    Medications Ordered  in ED Medications  ketorolac (TORADOL) injection 60 mg (has no administration in time range)    ED Course/ Medical Decision Making/ A&P                           Medical Decision Making Risk Prescription drug management.   [name] is here with low back pain.  Sciatic type pain radiating down the left leg.  No numbness or weakness.  May be some intermittent paresthesias.  No midline spinal tenderness.  No trauma.  Overuse injury likely from heavy lifting at work.  He is tender to the paraspinal muscles on the left as well as left gluteal muscles.  He is neurovascular neuromuscularly intact otherwise.  Overall suspect sciatica versus muscle spasm.  Other diagnosis considered but thought to be less likely a cauda equina,  epidural abscess, fracture.  He has no midline spinal tenderness.  He has no cauda equina symptoms.  No loss of bowel or bladder.  No urinary retention.  Overall we will prescribe Motrin, Medrol Dosepak, Flexeril.  Recommend Tylenol use at home as well.  Given a shot of Toradol in the ED.  Educated about sciatica.  Given information to follow-up with sports medicine.  Discharged in good condition.  Written off work for a few days.  This chart was dictated using voice recognition software.  Despite best efforts to proofread,  errors can occur which can change the documentation meaning.         Final Clinical Impression(s) / ED Diagnoses Final diagnoses:  Acute left-sided low back pain with left-sided sciatica    Rx / DC Orders ED Discharge Orders          Ordered    ibuprofen (ADVIL) 800 MG tablet  3 times daily        03/16/21 0903    cyclobenzaprine (FLEXERIL) 10 MG tablet  2 times daily PRN        03/16/21 0903    methylPREDNISolone (MEDROL DOSEPAK) 4 MG TBPK tablet        03/16/21 0903              Virgina Norfolk, DO 03/16/21 [phone]

## 2021-03-16 NOTE — Discharge Instructions (Addendum)
Recommend 800 mg ibuprofen every 8 hours as needed for pain.  Recommend 1000 mg of Tylenol every 6 hours as needed for pain.  Take Medrol Dosepak as prescribed.  Take Flexeril as prescribed.  This is a muscle relaxant.  Do not mix with alcohol or drugs or dangerous activities including driving.  Follow-up with your primary care doctor or sports medicine.

## 2021-03-19 ENCOUNTER — Other Ambulatory Visit: Payer: Self-pay

## 2021-03-19 ENCOUNTER — Encounter (HOSPITAL_BASED_OUTPATIENT_CLINIC_OR_DEPARTMENT_OTHER): Payer: Self-pay

## 2021-03-19 ENCOUNTER — Emergency Department (HOSPITAL_BASED_OUTPATIENT_CLINIC_OR_DEPARTMENT_OTHER)
Admission: EM | Admit: 2021-03-19 | Discharge: 2021-03-19 | Disposition: A | Discharge status: Home / Self Care | Payer: BC Managed Care – PPO | Attending: Emergency Medicine | Admitting: Emergency Medicine

## 2021-03-19 DIAGNOSIS — M5440 Lumbago with sciatica, unspecified side: Secondary | ICD-10-CM | POA: Diagnosis present

## 2021-03-19 DIAGNOSIS — R209 Unspecified disturbances of skin sensation: Secondary | ICD-10-CM | POA: Insufficient documentation

## 2021-03-19 DIAGNOSIS — M545 Low back pain, unspecified: Secondary | ICD-10-CM

## 2021-03-19 DIAGNOSIS — Z7982 Long term (current) use of aspirin: Secondary | ICD-10-CM | POA: Insufficient documentation

## 2021-03-19 MED ORDER — METHYLPREDNISOLONE SODIUM SUCC 125 MG IJ SOLR
125.0000 mg | Freq: Once | INTRAMUSCULAR | Status: AC
  Administered 2021-03-19: 125 mg via INTRAMUSCULAR
  Filled 2021-03-19: qty 2

## 2021-03-19 MED ORDER — KETOROLAC TROMETHAMINE 60 MG/2ML IM SOLN
60.0000 mg | Freq: Once | INTRAMUSCULAR | Status: AC
  Administered 2021-03-19: 60 mg via INTRAMUSCULAR
  Filled 2021-03-19: qty 2

## 2021-03-19 NOTE — Discharge Instructions (Signed)
Continue curretn medications

## 2021-03-19 NOTE — ED Triage Notes (Signed)
Pt arrives ambulatory to ED with reports of lower back pain X1 week states that he was seen here on Friday for the same was offered an 'injection' but did not take it. Pt reports he has been taking the 2 oral medications he was prescribed but states the pain is not getting better. Now having some numbness in his left toes. States pain is radiating into left leg.

## 2021-03-20 NOTE — ED Provider Notes (Signed)
[facility] EMERGENCY DEPARTMENT Provider Note   CSN: [id number] Arrival date & time: 03/19/21  1049     History  Chief Complaint  Patient presents with   Back Pain    Alan Wood is a 46 y.o. male.  Patient reports he was seen on 2 3 with low back pain he was started on Flexeril ibuprofen and prednisone patient reports he continues to have pain but pain is now going down his leg patient reports he has some numbness in his toes.  Patient reports he is unable to see his primary care doctor as they do not have appointments for several weeks.  The history is provided by the patient. No language interpreter was used.  Back Pain Location:  Lumbar spine Radiates to:  Does not radiate Pain severity:  Moderate Pain is:  Same all the time Progression:  Worsening Chronicity:  New Relieved by:  Nothing Worsened by:  Nothing Associated symptoms: no abdominal pain, no fever and no weakness   Risk factors: no hx of osteoporosis       Home Medications Prior to Admission medications   Medication Sig Start Date End Date Taking? Authorizing Provider  aspirin 81 MG tablet Take 81 mg by mouth daily.    [provider]  cyclobenzaprine (FLEXERIL) 10 MG tablet Take 1 tablet (10 mg total) by mouth 2 (two) times daily as needed for muscle spasms. 03/16/21   Curatolo, Adam, DO  ibuprofen (ADVIL) 800 MG tablet Take 1 tablet (800 mg total) by mouth 3 (three) times daily. 03/16/21   Curatolo, Adam, DO  methylPREDNISolone (MEDROL DOSEPAK) 4 MG TBPK tablet Follow package insert, see back of pack for instructions 03/16/21   Virgina Norfolk, DO      Allergies    Patient has no known allergies.    Review of Systems   Review of Systems  Constitutional:  Negative for fever.  Gastrointestinal:  Negative for abdominal pain.  Musculoskeletal:  Positive for back pain.  Neurological:  Negative for weakness.  All other systems reviewed and are negative.  Physical Exam Updated Vital  Signs BP (!) 143/109 (BP Location: Left Arm)    Pulse (!) 116    Temp 98 F (36.7 C) (Oral)    Resp 15    Ht 6\' 1"  (1.854 m)    Wt 129.3 kg    SpO2 97%    BMI 37.60 kg/m  Physical Exam Vitals and nursing note reviewed.  Constitutional:      Appearance: He is well-developed.  HENT:     Head: Normocephalic.  Pulmonary:     Effort: Pulmonary effort is normal.  Abdominal:     General: Abdomen is flat. There is no distension.  Musculoskeletal:        General: Normal range of motion.     Cervical back: Normal range of motion.     Comments: Diffusely tender LS spine, full range of motion of lateral lower legs  Skin:    General: Skin is warm.  Neurological:     General: No focal deficit present.     Mental Status: He is alert and oriented to person, place, and time.  Psychiatric:        Mood and Affect: Mood normal.    ED Results / Procedures / Treatments   Labs (all labs ordered are listed, but only abnormal results are displayed) Labs Reviewed - No data to display  EKG None  Radiology No results found.  Procedures Procedures  Medications Ordered in ED Medications  methylPREDNISolone sodium succinate (SOLU-MEDROL) 125 mg/2 mL injection 125 mg (125 mg Intramuscular Given 03/19/21 1408)  ketorolac (TORADOL) injection 60 mg (60 mg Intramuscular Given 03/19/21 1406)    ED Course/ Medical Decision Making/ A&P                           Medical Decision Making Problems Addressed: Acute low back pain, unspecified back pain laterality, unspecified whether sciatica present: acute illness or injury    Details: Patient began having low back pain after lifting he has been taking Flexeril ibuprofen and his own without relief.  Patient reports he was offered a steroid shot previously and would like to that nail  Risk Prescription drug management. Risk Details: Patient given injection of Toradol and Solu-Medrol patient is advised to follow-up with sports medicine for further  evaluation he is given the phone number for Dr. Jordan Likes          Final Clinical Impression(s) / ED Diagnoses Final diagnoses:  Acute low back pain, unspecified back pain laterality, unspecified whether sciatica present    Rx / DC Orders ED Discharge Orders     None         Osie Cheeks 03/20/21 1617    Cheryll Cockayne, MD 03/24/21 1514
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
## 2022-07-17 ENCOUNTER — Institutional Professional Consult (permissible substitution): Payer: BC Managed Care – PPO | Admitting: Plastic Surgery

## 2022-08-22 ENCOUNTER — Institutional Professional Consult (permissible substitution): Payer: BC Managed Care – PPO | Admitting: Plastic Surgery

## 2022-09-04 ENCOUNTER — Ambulatory Visit: Payer: BC Managed Care – PPO | Admitting: Family Medicine

## 2022-09-04 ENCOUNTER — Encounter: Payer: Self-pay | Admitting: Family Medicine
